# Patient Record
Sex: Female | Born: 1995 | Race: White | Hispanic: No | Marital: Single | Smoking: Never smoker
Health system: Southern US, Community
[De-identification: ages and names within clinical notes are randomized; demographics above are authoritative.]

## PROBLEM LIST (undated history)

## (undated) DIAGNOSIS — R002 Palpitations: Secondary | ICD-10-CM

## (undated) DIAGNOSIS — H814 Vertigo of central origin: Secondary | ICD-10-CM

## (undated) DIAGNOSIS — R0600 Dyspnea, unspecified: Secondary | ICD-10-CM

## (undated) HISTORY — DX: Dyspnea, unspecified: R06.00

## (undated) HISTORY — PX: APPENDECTOMY: SHX54

## (undated) HISTORY — DX: Palpitations: R00.2

## (undated) HISTORY — DX: Vertigo of central origin: H81.4

---
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
## 2016-07-07 ENCOUNTER — Ambulatory Visit
Admission: RE | Admit: 2016-07-07 | Discharge: 2016-07-07 | Disposition: A | Discharge status: Home / Self Care | Payer: PRIVATE HEALTH INSURANCE | Source: Ambulatory Visit | Attending: Family Medicine | Admitting: Family Medicine

## 2016-07-07 ENCOUNTER — Other Ambulatory Visit: Payer: Self-pay | Admitting: Family Medicine

## 2016-07-07 DIAGNOSIS — M79644 Pain in right finger(s): Secondary | ICD-10-CM

## 2018-05-09 IMAGING — CR DG FINGER THUMB 2+V*R*
3 series · 3 of 3 positions shown · non-contrast
Comparison: None.

CLINICAL DATA: Crush injury in car door.  Pain and swelling.

EXAM:
RIGHT THUMB 2+V

[x finger pa right]
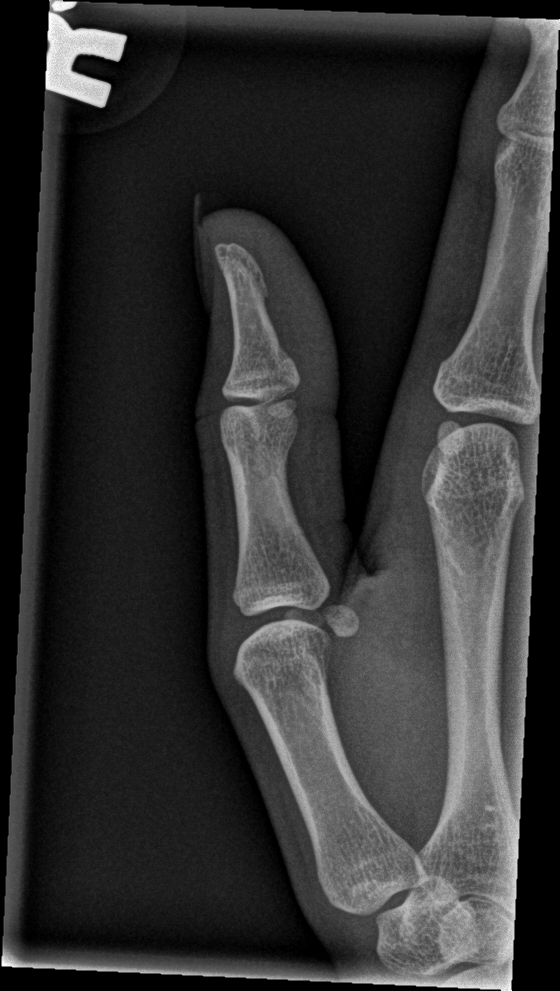

[x finger obl right]
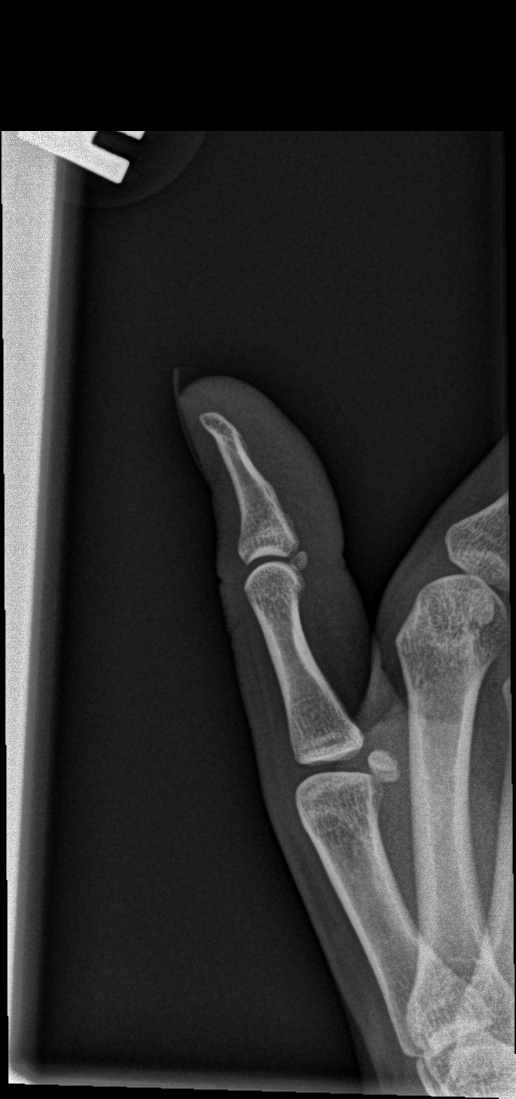

[x finger lat right]
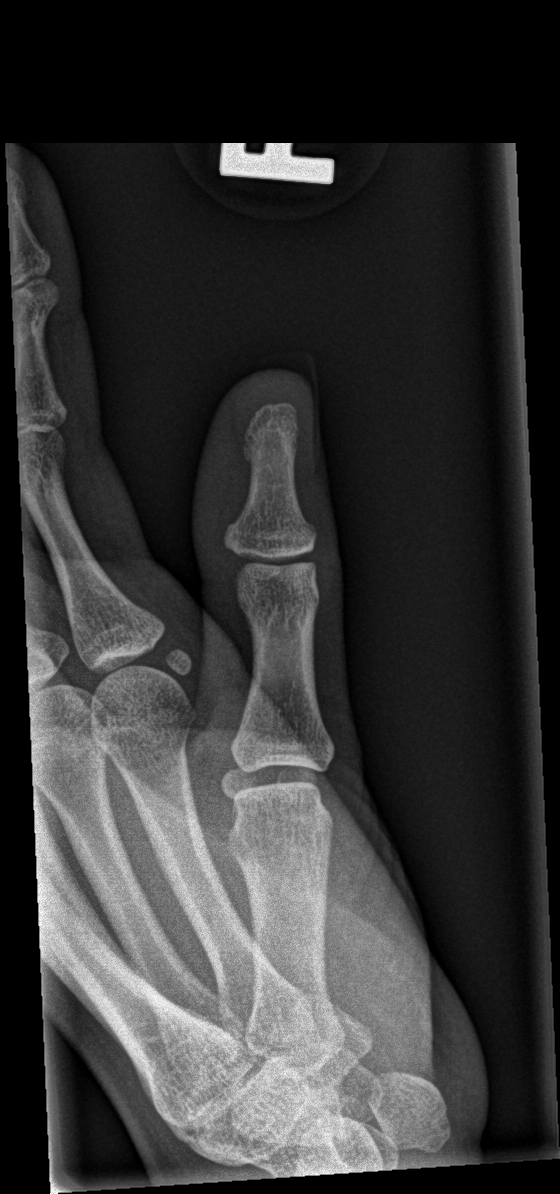

[3 of 3 positions shown; findings below may reference images not displayed]

FINDINGS: There is no evidence of fracture or dislocation. There is no
evidence of arthropathy or other focal bone abnormality. Soft
tissues are unremarkable
IMPRESSION: Negative.
